# Patient Record
Sex: Female | Born: 1980 | Race: Asian | Hispanic: No | Marital: Married | Smoking: Never smoker
Health system: Southern US, Community
[De-identification: ages and names within clinical notes are randomized; demographics above are authoritative.]

## PROBLEM LIST (undated history)

## (undated) DIAGNOSIS — R51 Headache: Secondary | ICD-10-CM

## (undated) DIAGNOSIS — K219 Gastro-esophageal reflux disease without esophagitis: Secondary | ICD-10-CM

## (undated) DIAGNOSIS — R519 Headache, unspecified: Secondary | ICD-10-CM

## (undated) HISTORY — PX: NO PAST SURGERIES: SHX2092

---

## 2014-07-14 NOTE — L&D Delivery Note (Signed)
Delivery Note At 4:56 PM a viable and healthy female was delivered via Vaginal, Spontaneous Delivery (Presentation: Right Occiput Anterior).  APGAR: 7, 9; weight 7 lb 5.6 oz (3335 g).   Placenta status: Intact, Spontaneous.  Cord: 3 vessels with the following complications: None.   The patient pushed for 3 HOURS without an epidural!  The infant's head delivered over an intact perineum in the ROA presentation with a loose nuchal cord that was reduced on the perineum. The remainder of the infant's body  Delivered and the infant was passed to the maternal abdomen. The placenta was delivered spontaneous, intact, with a three vessel cord. Following delivery of the placenta a partial third-degree perineal laceration was noted. Following infiltration of the perineum with 1% lidocaine without epinephrine the partial 3rd was repaired with interrupted sutures of 0 Vicryl.  The remaining second-degree perineal laceration was repaired in the usual fashion.  Anesthesia: Local  Episiotomy: None Lacerations: 3rd degree Suture Repair: 0 vicryl and 3-0 vicryl Est. Blood Loss (mL): 143  Mom to postpartum.  Baby to Couplet care / Skin to Skin.  Terry Sanders H. 04/24/2015, 9:13 AM

## 2014-10-25 LAB — OB RESULTS CONSOLE GC/CHLAMYDIA
CHLAMYDIA, DNA PROBE: NEGATIVE
GC PROBE AMP, GENITAL: NEGATIVE

## 2014-10-25 LAB — OB RESULTS CONSOLE RPR: RPR: NONREACTIVE

## 2014-10-25 LAB — OB RESULTS CONSOLE RUBELLA ANTIBODY, IGM: Rubella: IMMUNE

## 2014-10-25 LAB — OB RESULTS CONSOLE HIV ANTIBODY (ROUTINE TESTING): HIV: NONREACTIVE

## 2014-10-25 LAB — OB RESULTS CONSOLE HEPATITIS B SURFACE ANTIGEN: Hepatitis B Surface Ag: NEGATIVE

## 2014-10-25 LAB — OB RESULTS CONSOLE ABO/RH: RH Type: POSITIVE

## 2015-04-03 LAB — OB RESULTS CONSOLE GBS: STREP GROUP B AG: NEGATIVE

## 2015-04-23 ENCOUNTER — Inpatient Hospital Stay (HOSPITAL_COMMUNITY)
Admission: AD | Admit: 2015-04-23 | Discharge: 2015-04-25 | DRG: 775 | Disposition: A | Discharge status: Home / Self Care | Payer: Managed Care, Other (non HMO) | Source: Ambulatory Visit | Attending: Obstetrics and Gynecology | Admitting: Obstetrics and Gynecology

## 2015-04-23 ENCOUNTER — Encounter (HOSPITAL_COMMUNITY): Payer: Self-pay | Admitting: *Deleted

## 2015-04-23 DIAGNOSIS — K219 Gastro-esophageal reflux disease without esophagitis: Secondary | ICD-10-CM | POA: Diagnosis present

## 2015-04-23 DIAGNOSIS — Z3A38 38 weeks gestation of pregnancy: Secondary | ICD-10-CM

## 2015-04-23 DIAGNOSIS — R51 Headache: Secondary | ICD-10-CM | POA: Diagnosis present

## 2015-04-23 DIAGNOSIS — IMO0001 Reserved for inherently not codable concepts without codable children: Secondary | ICD-10-CM

## 2015-04-23 DIAGNOSIS — O9962 Diseases of the digestive system complicating childbirth: Secondary | ICD-10-CM | POA: Diagnosis present

## 2015-04-23 HISTORY — DX: Headache: R51

## 2015-04-23 HISTORY — DX: Gastro-esophageal reflux disease without esophagitis: K21.9

## 2015-04-23 HISTORY — DX: Headache, unspecified: R51.9

## 2015-04-23 LAB — CBC
HCT: 40.5 % (ref 36.0–46.0)
Hemoglobin: 13.3 g/dL (ref 12.0–15.0)
MCH: 23.6 pg — ABNORMAL LOW (ref 26.0–34.0)
MCHC: 32.8 g/dL (ref 30.0–36.0)
MCV: 71.9 fL — ABNORMAL LOW (ref 78.0–100.0)
PLATELETS: 165 10*3/uL (ref 150–400)
RBC: 5.63 MIL/uL — AB (ref 3.87–5.11)
RDW: 15.3 % (ref 11.5–15.5)
WBC: 8.3 10*3/uL (ref 4.0–10.5)

## 2015-04-23 LAB — ABO/RH: ABO/RH(D): O POS

## 2015-04-23 LAB — TYPE AND SCREEN
ABO/RH(D): O POS
Antibody Screen: NEGATIVE

## 2015-04-23 MED ORDER — ONDANSETRON HCL 4 MG PO TABS: 4.0000 mg | ORAL_TABLET | ORAL | Status: DC | PRN

## 2015-04-23 MED ORDER — IBUPROFEN 600 MG PO TABS: 600.0000 mg | ORAL_TABLET | Freq: Four times a day (QID) | ORAL | Status: DC

## 2015-04-23 MED ORDER — TETANUS-DIPHTH-ACELL PERTUSSIS 5-2.5-18.5 LF-MCG/0.5 IM SUSP: 0.5000 mL | Freq: Once | INTRAMUSCULAR | Status: DC

## 2015-04-23 MED ORDER — DIPHENHYDRAMINE HCL 50 MG/ML IJ SOLN: 12.5000 mg | INTRAMUSCULAR | Status: DC | PRN

## 2015-04-23 MED ORDER — LIDOCAINE HCL (PF) 1 % IJ SOLN
30.0000 mL | INTRAMUSCULAR | Status: DC | PRN
  Administered 2015-04-23: 30 mL via SUBCUTANEOUS
  Filled 2015-04-23: qty 30

## 2015-04-23 MED ORDER — FENTANYL 2.5 MCG/ML BUPIVACAINE 1/10 % EPIDURAL INFUSION (WH - ANES): 14.0000 mL/h | INTRAMUSCULAR | Status: DC | PRN

## 2015-04-23 MED ORDER — PRENATAL MULTIVITAMIN CH
1.0000 | ORAL_TABLET | Freq: Every day | ORAL | Status: DC
  Administered 2015-04-24 – 2015-04-25 (×2): 1 via ORAL
  Filled 2015-04-23 (×2): qty 1

## 2015-04-23 MED ORDER — IBUPROFEN 600 MG PO TABS
600.0000 mg | ORAL_TABLET | Freq: Four times a day (QID) | ORAL | Status: DC
  Administered 2015-04-23: 600 mg via ORAL
  Filled 2015-04-23: qty 1

## 2015-04-23 MED ORDER — OXYTOCIN 40 UNITS IN LACTATED RINGERS INFUSION - SIMPLE MED
62.5000 mL/h | INTRAVENOUS | Status: DC
  Administered 2015-04-23: 62.5 mL/h via INTRAVENOUS
  Filled 2015-04-23: qty 1000

## 2015-04-23 MED ORDER — DIBUCAINE 1 % RE OINT: 1.0000 "application " | TOPICAL_OINTMENT | RECTAL | Status: DC | PRN

## 2015-04-23 MED ORDER — SIMETHICONE 80 MG PO CHEW: 80.0000 mg | CHEWABLE_TABLET | ORAL | Status: DC | PRN

## 2015-04-23 MED ORDER — IBUPROFEN 600 MG PO TABS
600.0000 mg | ORAL_TABLET | Freq: Four times a day (QID) | ORAL | Status: DC
  Administered 2015-04-23 – 2015-04-25 (×7): 600 mg via ORAL
  Filled 2015-04-23 (×6): qty 1

## 2015-04-23 MED ORDER — FLEET ENEMA 7-19 GM/118ML RE ENEM: 1.0000 | ENEMA | RECTAL | Status: DC | PRN

## 2015-04-23 MED ORDER — LANOLIN HYDROUS EX OINT: TOPICAL_OINTMENT | CUTANEOUS | Status: DC | PRN

## 2015-04-23 MED ORDER — OXYCODONE-ACETAMINOPHEN 5-325 MG PO TABS
1.0000 | ORAL_TABLET | ORAL | Status: DC | PRN
  Administered 2015-04-24: 1 via ORAL
  Filled 2015-04-23: qty 1

## 2015-04-23 MED ORDER — BUTORPHANOL TARTRATE 1 MG/ML IJ SOLN
1.0000 mg | INTRAMUSCULAR | Status: DC | PRN
  Administered 2015-04-23: 1 mg via INTRAVENOUS
  Filled 2015-04-23: qty 1

## 2015-04-23 MED ORDER — BENZOCAINE-MENTHOL 20-0.5 % EX AERO
1.0000 "application " | INHALATION_SPRAY | CUTANEOUS | Status: DC | PRN
  Administered 2015-04-23: 1 via TOPICAL
  Filled 2015-04-23: qty 56

## 2015-04-23 MED ORDER — ONDANSETRON HCL 4 MG/2ML IJ SOLN: 4.0000 mg | Freq: Four times a day (QID) | INTRAMUSCULAR | Status: DC | PRN

## 2015-04-23 MED ORDER — PHENYLEPHRINE 40 MCG/ML (10ML) SYRINGE FOR IV PUSH (FOR BLOOD PRESSURE SUPPORT): 80.0000 ug | PREFILLED_SYRINGE | INTRAVENOUS | Status: DC | PRN

## 2015-04-23 MED ORDER — DIPHENHYDRAMINE HCL 25 MG PO CAPS: 25.0000 mg | ORAL_CAPSULE | Freq: Four times a day (QID) | ORAL | Status: DC | PRN

## 2015-04-23 MED ORDER — OXYCODONE-ACETAMINOPHEN 5-325 MG PO TABS: 1.0000 | ORAL_TABLET | ORAL | Status: DC | PRN

## 2015-04-23 MED ORDER — OXYCODONE-ACETAMINOPHEN 5-325 MG PO TABS: 2.0000 | ORAL_TABLET | ORAL | Status: DC | PRN

## 2015-04-23 MED ORDER — EPHEDRINE 5 MG/ML INJ: 10.0000 mg | INTRAVENOUS | Status: DC | PRN

## 2015-04-23 MED ORDER — CITRIC ACID-SODIUM CITRATE 334-500 MG/5ML PO SOLN: 30.0000 mL | ORAL | Status: DC | PRN

## 2015-04-23 MED ORDER — ONDANSETRON HCL 4 MG/2ML IJ SOLN: 4.0000 mg | INTRAMUSCULAR | Status: DC | PRN

## 2015-04-23 MED ORDER — LACTATED RINGERS IV SOLN: 500.0000 mL | INTRAVENOUS | Status: DC | PRN

## 2015-04-23 MED ORDER — SENNOSIDES-DOCUSATE SODIUM 8.6-50 MG PO TABS
2.0000 | ORAL_TABLET | ORAL | Status: DC
  Administered 2015-04-23 – 2015-04-24 (×2): 2 via ORAL
  Filled 2015-04-23 (×2): qty 2

## 2015-04-23 MED ORDER — WITCH HAZEL-GLYCERIN EX PADS
1.0000 "application " | MEDICATED_PAD | CUTANEOUS | Status: DC | PRN
  Administered 2015-04-23: 1 via TOPICAL

## 2015-04-23 MED ORDER — OXYTOCIN BOLUS FROM INFUSION
500.0000 mL | INTRAVENOUS | Status: DC
  Administered 2015-04-23: 500 mL via INTRAVENOUS

## 2015-04-23 MED ORDER — LACTATED RINGERS IV SOLN
INTRAVENOUS | Status: DC
  Administered 2015-04-23: 09:00:00 via INTRAVENOUS

## 2015-04-23 MED ORDER — ACETAMINOPHEN 325 MG PO TABS: 650.0000 mg | ORAL_TABLET | ORAL | Status: DC | PRN

## 2015-04-23 MED ORDER — ZOLPIDEM TARTRATE 5 MG PO TABS
5.0000 mg | ORAL_TABLET | Freq: Every evening | ORAL | Status: DC | PRN
Start: 2015-04-23 — End: 2015-04-25

## 2015-04-23 NOTE — MAU Note (Signed)
Urine in lab 

## 2015-04-23 NOTE — MAU Note (Signed)
Pt presents to MAU with complaints of leakage of fluid that started around 7 this morning. Mild cramping

## 2015-04-23 NOTE — H&P (Signed)
Terry Sanders is a 34 y.o. female presenting for leaking fluid  34 yo G1p0 At 38+4 presents c/o leaking fluid and was found to be 5-6 cm dilated.   History OB History    Gravida Para Term Preterm AB TAB SAB Ectopic Multiple Living   0 0 0 0 0 0 1     Past Medical History  Diagnosis Date  . GERD (gastroesophageal reflux disease)     on Prilosec  . Headache     relieved by Tylenol   Past Surgical History  Procedure Laterality Date  . No past surgeries     Family History: family history is not on file. Social History:  reports that she has never smoked. She has never used smokeless tobacco. She reports that she does not drink alcohol or use illicit drugs.   Prenatal Transfer Tool  Maternal Diabetes: No Genetic Screening: normal Maternal Ultrasounds/Referrals: Normal Fetal Ultrasounds or other Referrals:  None Maternal Substance Abuse:  No Significant Maternal Medications:  None Significant Maternal Lab Results:  None Other Comments:  None  ROS  Dilation: 10 Effacement (%): 100 Station: Crowning Exam by:: Dr. Tenny Craw Blood pressure 134/81, pulse 97, temperature 98.6 F (37 C), temperature source Oral, resp. rate 18, height  (1.499 m), weight 132 lb (59.875 kg), last menstrual period 07/27/2014, SpO2 98 %, unknown if currently breastfeeding. Exam Physical Exam  Prenatal labs: ABO, Rh: --/--/O POS, O POS (10/10 0915) Antibody: NEG (10/10 0915) Rubella: Immune (04/13 0000) RPR: Nonreactive (04/13 0000)  HBsAg: Negative (04/13 0000)  HIV: Non-reactive (04/13 0000)  GBS: Negative (09/20 0000)   Assessment/Plan: 1) Admit 2) Epidural on request 3) Anticipate svd   Ammarie Matsuura H. 04/23/2015, 8:54 PM

## 2015-04-24 LAB — CBC
HCT: 31 % — ABNORMAL LOW (ref 36.0–46.0)
Hemoglobin: 10.1 g/dL — ABNORMAL LOW (ref 12.0–15.0)
MCH: 23.2 pg — ABNORMAL LOW (ref 26.0–34.0)
MCHC: 32.6 g/dL (ref 30.0–36.0)
MCV: 71.1 fL — ABNORMAL LOW (ref 78.0–100.0)
PLATELETS: 144 10*3/uL — AB (ref 150–400)
RBC: 4.36 MIL/uL (ref 3.87–5.11)
RDW: 15 % (ref 11.5–15.5)
WBC: 15.4 10*3/uL — AB (ref 4.0–10.5)

## 2015-04-24 LAB — RPR: RPR Ser Ql: NONREACTIVE

## 2015-04-24 NOTE — Progress Notes (Signed)
Patient is eating, ambulating, voiding.  Pain control is good.  Appropriate lochia, no complaints.  Filed Vitals:   04/23/15 1905 04/23/15 2005 04/24/15 0020 04/24/15 0851  BP: 122/80 134/81 125/84 122/74  Pulse: 89 97 92 81  Temp: 99.2 F (37.3 C) 98.6 F (37 C) 99.4 F (37.4 C) 98.9 F (37.2 C)  TempSrc: Oral Oral Oral Oral  Resp: Height:      Weight:      SpO2: 98%       Fundus firm Perineum without swelling. Ext: no CT  Lab Results  Component Value Date   WBC 15.4* 04/24/2015   HGB 10.1* 04/24/2015   HCT 31.0* 04/24/2015   MCV 71.1* 04/24/2015   PLT 144* 04/24/2015    --/--/O POS, O POS (10/10 0915)  A/P Post partum day 1. Pt doing well. Circ desired, consent obtained.  Awaiting ok by peds as baby not feeding or urinating well  Routine care.  Expect d/c 10/12.    Terry Sanders

## 2015-04-25 MED ORDER — IBUPROFEN 600 MG PO TABS: 600.0000 mg | ORAL_TABLET | Freq: Four times a day (QID) | ORAL | Status: DC

## 2015-04-25 MED ORDER — OXYCODONE-ACETAMINOPHEN 5-325 MG PO TABS: 1.0000 | ORAL_TABLET | ORAL | Status: DC | PRN

## 2015-04-25 MED ORDER — SENNOSIDES-DOCUSATE SODIUM 8.6-50 MG PO TABS: 2.0000 | ORAL_TABLET | Freq: Every evening | ORAL | Status: DC | PRN

## 2015-04-25 NOTE — Progress Notes (Signed)
Post Partum Day 2 Subjective: no complaints, up ad lib, voiding, tolerating PO, + flatus and bottle feeding  Objective: Blood pressure 139/82, pulse 83, temperature 98 F (36.7 C), temperature source Oral, resp. rate 18, height 4\' 11"  (1.499 m), weight 59.875 kg (132 lb), last menstrual period 07/27/2014, SpO2 98 %, unknown if currently breastfeeding.  Physical Exam:  General: alert, cooperative and no distress Lochia: appropriate Uterine Fundus: firm DVT Evaluation: No evidence of DVT seen on physical exam. Negative Homan's sign. No cords or calf tenderness. No significant calf/ankle edema.   Recent Labs  04/23/15 0915 04/24/15 0528  HGB 13.3 10.1*  HCT 40.5 31.0*    Assessment/Plan: Discharge home   LOS: 2 days   Terry Sanders Terry Sanders 04/25/2015, 11:10 AM

## 2015-04-25 NOTE — Discharge Summary (Signed)
Obstetric Discharge Summary Reason for Admission: onset of labor and spontaneous abortion Prenatal Procedures: none Intrapartum Procedures: spontaneous vaginal delivery Postpartum Procedures: none Complications-Operative and Postpartum: 3rd degree perineal laceration HEMOGLOBIN  Date Value Ref Range Status  04/24/2015 10.1* 12.0 - 15.0 g/dL Final    Comment:    DELTA CHECK NOTED REPEATED TO VERIFY    HCT  Date Value Ref Range Status  04/24/2015 31.0* 36.0 - 46.0 % Final    Physical Exam:  General: alert, cooperative and no distress Lochia: appropriate Uterine Fundus: firm DVT Evaluation: No evidence of DVT seen on physical exam. Negative Homan's sign. No cords or calf tenderness. No significant calf/ankle edema.   Discharge Diagnoses: Term Pregnancy-delivered  Discharge Information: Date: 04/25/2015 Activity: pelvic rest Diet: routine Medications: Ibuprofen, Colace and Percocet Condition: stable Instructions: refer to practice specific booklet Discharge to: home   Newborn Data: Live born female  Birth Weight: 7 lb 5.6 oz (3335 g) APGAR: 7, 9  Home with mother.  Essie HartINN, Alanea Woolridge [name] 04/25/2015, 11:13 AM

## 2017-10-13 ENCOUNTER — Encounter: Payer: Self-pay | Admitting: Family Medicine

## 2017-10-13 ENCOUNTER — Ambulatory Visit: Payer: Managed Care, Other (non HMO) | Admitting: Family Medicine

## 2017-10-13 VITALS — BP 118/70 | HR 100 | Temp 98.3°F | Wt 110.0 lb

## 2017-10-13 DIAGNOSIS — J069 Acute upper respiratory infection, unspecified: Secondary | ICD-10-CM

## 2017-10-13 DIAGNOSIS — J029 Acute pharyngitis, unspecified: Secondary | ICD-10-CM

## 2017-10-13 DIAGNOSIS — B9789 Other viral agents as the cause of diseases classified elsewhere: Secondary | ICD-10-CM

## 2017-10-13 LAB — POCT RAPID STREP A (OFFICE): RAPID STREP A SCREEN: NEGATIVE

## 2017-10-13 MED ORDER — BENZONATATE 100 MG PO CAPS: 100.0000 mg | ORAL_CAPSULE | Freq: Two times a day (BID) | ORAL | 0 refills | Status: AC | PRN

## 2017-10-13 NOTE — Progress Notes (Signed)
Subjective:    Patient ID: Terry Sanders, female    DOB: 03/28/1981, 37 y.o.   MRN: [id number]  Chief Complaint  Patient presents with  . Sore Throat    HPI Patient was seen today for acute concern.  Pt endorses sore throat and cough times 6 days.  Pt was seen by UC on 3/29 for similar symptoms.  At that time patient was told she had a viral URI as we test was negative.  Pt was given Zyrtec and Flonase.  She took both for 2 days but stopped as she did not notice an improvement in symptoms.  Pt also endorses pressure in her ears and coughing and rhinorrhea if she talks too much.  Pt endorses difficulty sleeping 2/2 cough.  Pt denies sick contacts, fever, chills, n/v, ear pain, facial pain, or pain in teeth.  Past Medical History:  Diagnosis Date  . GERD (gastroesophageal reflux disease)    on Prilosec  . Headache    relieved by Tylenol    No Known Allergies  ROS General: Denies fever, chills, night sweats, changes in weight, changes in appetite HEENT: Denies headaches, ear pain, changes in vision      +rhinorrhea, sore throat CV: Denies CP, palpitations, SOB, orthopnea Pulm: Denies SOB, wheezing  + cough GI: Denies abdominal pain, nausea, vomiting, diarrhea, constipation GU: Denies dysuria, hematuria, frequency, vaginal discharge Msk: Denies muscle cramps, joint pains Neuro: Denies weakness, numbness, tingling Skin: Denies rashes, bruising Psych: Denies depression, anxiety, hallucinations     Objective:    Blood pressure 118/70, pulse 100, temperature 98.3 F (36.8 C), temperature source Oral, weight 110 lb (49.9 kg), SpO2 97 %, unknown if currently breastfeeding.   Gen. Pleasant, well-nourished, in no distress, normal affect   HEENT: [place]/AT, face symmetric, no scleral icterus, PERRLA, nares patent without drainage, pharynx without erythema or exudate.  TMs normal bilaterally.  No cervical lymphadenopathy. Lungs: no accessory muscle use, CTAB, no wheezes or rales Cardiovascular:  RRR, no m/r/g, no peripheral edema Neuro:  A&Ox3, CN II-XII intact, normal gait   Wt Readings from Last 3 Encounters:  10/13/17 110 lb (49.9 kg)  04/23/15 132 lb (59.9 kg)    Lab Results  Component Value Date   WBC 15.4 (H) 04/24/2015   HGB 10.1 (L) 04/24/2015   HCT 31.0 (L) 04/24/2015   PLT 144 (L) 04/24/2015    Assessment/Plan:  Viral URI with cough  -Continue supportive care -Patient encouraged to gargle with warm salt water or Chloraseptic spray and increase p.o. intake of fluids -Tylenol okay for any pain/discomfort - Plan: benzonatate (TESSALON) 100 MG capsule  Sore throat - Plan: POC Rapid Strep A negative  Patient encouraged to keep follow-up for new patient appointment in the next few days.  Terry AmsterdamShannon Corsica Franson, MD

## 2017-10-13 NOTE — Patient Instructions (Addendum)
You can gargle your throat with with warm salt water or Chloraseptic spray.  I have sent a prescription for Tessalon, a medicine to help with your cough to your pharmacy.  You can also take Tylenol as needed for any pain/discomfort. Cough, Adult Coughing is a reflex that clears your throat and your airways. Coughing helps to heal and protect your lungs. It is normal to cough occasionally, but a cough that happens with other symptoms or lasts a long time may be a sign of a condition that needs treatment. A cough may last only 2-3 weeks (acute), or it may last longer than 8 weeks (chronic). What are the causes? Coughing is commonly caused by:  Breathing in substances that irritate your lungs.  A viral or bacterial respiratory infection.  Allergies.  Asthma.  Postnasal drip.  Smoking.  Acid backing up from the stomach into the esophagus (gastroesophageal reflux).  Certain medicines.  Chronic lung problems, including COPD (or rarely, lung cancer).  Other medical conditions such as heart failure.  Follow these instructions at home: Pay attention to any changes in your symptoms. Take these actions to help with your discomfort:  Take medicines only as told by your health care provider. ? If you were prescribed an antibiotic medicine, take it as told by your health care provider. Do not stop taking the antibiotic even if you start to feel better. ? Talk with your health care provider before you take a cough suppressant medicine.  Drink enough fluid to keep your urine clear or pale yellow.  If the air is dry, use a cold steam vaporizer or humidifier in your bedroom or your home to help loosen secretions.  Avoid anything that causes you to cough at work or at home.  If your cough is worse at night, try sleeping in a semi-upright position.  Avoid cigarette smoke. If you smoke, quit smoking. If you need help quitting, ask your health care provider.  Avoid caffeine.  Avoid  alcohol.  Rest as needed.  Contact a health care provider if:  You have new symptoms.  You cough up pus.  Your cough does not get better after 2-3 weeks, or your cough gets worse.  You cannot control your cough with suppressant medicines and you are losing sleep.  You develop pain that is getting worse or pain that is not controlled with pain medicines.  You have a fever.  You have unexplained weight loss.  You have night sweats. Get help right away if:  You cough up blood.  You have difficulty breathing.  Your heartbeat is very fast. This information is not intended to replace advice given to you by your health care provider. Make sure you discuss any questions you have with your health care provider. Document Released: 12/27/2010 Document Revised: 12/06/2015 Document Reviewed: 09/06/2014 Elsevier Interactive Patient Education  2018 Elsevier Inc.  Sore Throat When you have a sore throat, your throat may:  Hurt.  Burn.  Feel irritated.  Feel scratchy.  Many things can cause a sore throat, including:  An infection.  Allergies.  Dryness in the air.  Smoke or pollution.  Gastroesophageal reflux disease (GERD).  A sore throat can be the first sign of another sickness. It can happen with other problems, like coughing or a fever. Most sore throats go away without treatment. Follow these instructions at home:  Take over-the-counter medicines only as told by your doctor.  Drink enough fluids to keep your pee (urine) clear or pale yellow.  Rest  when you feel you need to.  To help with pain, try: ? Sipping warm liquids, such as broth, herbal tea, or warm water. ? Eating or drinking cold or frozen liquids, such as frozen ice pops. ? Gargling with a salt-water mixture 3-4 times a day or as needed. To make a salt-water mixture, add -1 tsp of salt in 1 cup of warm water. Mix it until you cannot see the salt anymore. ? Sucking on hard candy or throat  lozenges. ? Putting a cool-mist humidifier in your bedroom at night. ? Sitting in the bathroom with the door closed for 5-10 minutes while you run hot water in the shower.  Do not use any tobacco products, such as cigarettes, chewing tobacco, and e-cigarettes. If you need help quitting, ask your doctor. Contact a doctor if:  You have a fever for more than 2-3 days.  You keep having symptoms for more than 2-3 days.  Your throat does not get better in 7 days.  You have a fever and your symptoms suddenly get worse. Get help right away if:  You have trouble breathing.  You cannot swallow fluids, soft foods, or your saliva.  You have swelling in your throat or neck that gets worse.  You keep feeling like you are going to throw up (vomit).  You keep throwing up. This information is not intended to replace advice given to you by your health care provider. Make sure you discuss any questions you have with your health care provider. Document Released: 04/08/2008 Document Revised: 02/24/2016 Document Reviewed: 04/20/2015 Elsevier Interactive Patient Education  [employer].

## 2017-10-19 ENCOUNTER — Encounter: Payer: Self-pay | Admitting: Family Medicine

## 2017-10-19 ENCOUNTER — Ambulatory Visit: Payer: Managed Care, Other (non HMO) | Admitting: Family Medicine

## 2017-10-19 VITALS — BP 102/64 | HR 90 | Temp 98.1°F | Ht 59.0 in | Wt 110.0 lb

## 2017-10-19 DIAGNOSIS — E049 Nontoxic goiter, unspecified: Secondary | ICD-10-CM

## 2017-10-19 DIAGNOSIS — Z7689 Persons encountering health services in other specified circumstances: Secondary | ICD-10-CM | POA: Diagnosis not present

## 2017-10-19 LAB — T4, FREE: FREE T4: 0.9 ng/dL (ref 0.60–1.60)

## 2017-10-19 LAB — TSH: TSH: 0.76 u[IU]/mL (ref 0.35–4.50)

## 2017-10-19 NOTE — Patient Instructions (Signed)
Goiter A goiter is an enlarged thyroid gland. The thyroid gland is located in the lower front of the neck. The gland produces hormones that regulate mood, body temperature, pulse rate, and digestion. Most goiters are painless and are not a cause for serious concern. Goiters and conditions that cause goiters can be treated, if necessary. What are the causes? Causes of this condition include:  Diseases that attack healthy cells in your body (autoimmune diseases) and affect your thyroid function, such as: ? Graves disease. This causes too much thyroid hormone to be produced and it makes your thyroid overly active (hyperthyroidism). ? Hashimoto disease. This type of inflammation of the thyroid (thyroiditis) causes too little thyroid hormone to be produced and it makes your thyroid not active enough (hypothyroidism).  Other conditions that cause thyroiditis.  Nodular goiter. This means that there are one or more small growths on your thyroid. These can create too much thyroid hormone.  Pregnancy.  Thyroid cancer. This is rare.  Certain medicines.  Radiation exposure.  Iodine deficiency.  In some cases, the cause may not be known (idiopathic). What increases the risk? This condition is more likely to develop in:  People who have a family history of goiter.  Women.  People who do not get enough iodine in their diet.  People who are older than 40.  People who smoke tobacco.  What are the signs or symptoms? Common symptoms of this condition include:  Swelling in the lower part of the neck. This swelling can range from a very small bump to a large lump.  A tight feeling in the throat.  A hoarse voice.  Other symptoms include:  Coughing.  Wheezing.  Difficulty swallowing.  Difficulty breathing.  Bulging neck veins.  Dizziness.  In some cases, there are no symptoms and thyroid hormone levels may be normal. When a goiter is the result of hyperthyroidism, symptoms may  also include:  Nervousness or restlessness.  Inability to tolerate heat.  Unexplained weight loss.  Diarrhea.  Change in the texture of hair or skin.  Changes in heart beat, such as skipped beats, extra beats, or a rapid heart rate.  Loss of menstruation.  Shaky hands.  Increased appetite.  Sleep problems.  When a goiter is the result of hypothyroidism, symptoms may also include:  Feeling like you have no energy (lethargy).  Inability to tolerate cold.  Weight gain that is not explained by a change in diet or exercise habits.  Dry skin.  Coarse hair.  Menstrual irregularity.  Constipation.  Sadness or depression.  How is this diagnosed? This condition may be diagnosed with a medical history and physical exam. You may also have other tests, including:  Blood tests to check thyroid function.  Imaging tests, such as: ? Ultrasonography. ? CT scan. ? MRI. ? Thyroid scan. You will be given a safe radioactive injection, then images will be taken of your thyroid.  Tissue sample (biopsy) of the goiter or any nodules. This checks to see if the goiter or nodules are cancerous.  How is this treated? Treatment for this condition depends on the cause. Treatment may include:  Medicines to control your thyroid.  Anti-inflammatory or steroid medicines, if inflammation is the cause.  Iodine supplements or changes in diet, if the goiter is caused by iodine deficiency.  Radiation therapy.  Surgery to remove your thyroid.  In some cases, no treatment is necessary, and your health care provider will monitor your condition at regular checkups. Follow these instructions at   home:  Follow recommendations from your health care provider for any changes to your diet.  Take over-the-counter and prescription medicines only as told by your health care provider.  Do not use any tobacco products, including cigarettes, chewing tobacco, or e-cigarettes. If you need help quitting,  ask your health care provider.  Keep all follow-up appointments as told by your health care provider. This is important. Contact a health care provider if:  Your symptoms do not get better with treatment. Get help right away if:  You develop sudden, unexplained confusion or other mental changes.  You have nausea, vomiting, or diarrhea.  You develop a fever.  Your skin or the whites of your eyes appear yellow (jaundice).  You develop chest pain.  You have trouble breathing or swallowing.  You suddenly become very weak.  You experience extreme restlessness. This information is not intended to replace advice given to you by your health care provider. Make sure you discuss any questions you have with your health care provider. Document Released: 12/18/2009 Document Revised: 01/18/2016 Document Reviewed: 06/26/2014 Elsevier Interactive Patient Education  2018 Elsevier Inc.  

## 2017-10-19 NOTE — Progress Notes (Signed)
Patient presents to clinic today to establish care.  She was seen on 10/13/17 for an acute visit for URI.  SUBJECTIVE: PMH: Pt is a 37 yo female with no sig pmh.  Pt states she is "80-90% better" since last OFV.  Pt is not currently on any medications and is without complaint.  Allergies: NKDA  Past Surgical hx: None  Social Hx: Pt is married.  Pt has a 2.5 yo son.  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  Pt denies tobacco, EtOH, and drug use.   Family Med Hx: Mom-alive Dad- desc, liver cancer Sister-Kim, alive Brother- Charlynn CourtDola, alive Brother-Hung-alive  Health Maintenance: Dental -- Dr. Elroy ChannelIrvin immunizations -- influenza vaccine 2018 PAP -- 2016?  Pt unsure, but is seen by OB/Gyn.    Past Medical History:  Diagnosis Date  . GERD (gastroesophageal reflux disease)    on Prilosec  . Headache    relieved by Tylenol    Past Surgical History:  Procedure Laterality Date  . NO PAST SURGERIES      Current Outpatient Medications on File Prior to Visit  Medication Sig Dispense Refill  . benzonatate (TESSALON) 100 MG capsule Take 1 capsule (100 mg total) by mouth 2 (two) times daily as needed for cough. 20 capsule 0   No current facility-administered medications on file prior to visit.     No Known Allergies  History reviewed. No pertinent family history.  Social History   Socioeconomic History  . Marital status: Married    Spouse name: Not on file  . Number of children: Not on file  . Years of education: Not on file  . Highest education level: Not on file  Occupational History  . Not on file  Social Needs  . Financial resource strain: Not on file  . Food insecurity:    Worry: Not on file    Inability: Not on file  . Transportation needs:    Medical: Not on file    Non-medical: Not on file  Tobacco Use  . Smoking status: Never Smoker  . Smokeless tobacco: Never Used  Substance and Sexual Activity  . Alcohol use: No  . Drug use: No  . Sexual  activity: Yes  Lifestyle  . Physical activity:    Days per week: Not on file    Minutes per session: Not on file  . Stress: Not on file  Relationships  . Social connections:    Talks on phone: Not on file    Gets together: Not on file    Attends religious service: Not on file    Active member of club or organization: Not on file    Attends meetings of clubs or organizations: Not on file    Relationship status: Not on file  . Intimate partner violence:    Fear of current or ex partner: Not on file    Emotionally abused: Not on file    Physically abused: Not on file    Forced sexual activity: Not on file  Other Topics Concern  . Not on file  Social History Narrative  . Not on file    ROS General: Denies fever, chills, night sweats, changes in weight, changes in appetite HEENT: Denies headaches, ear pain, changes in vision, rhinorrhea, sore throat CV: Denies CP, palpitations, SOB, orthopnea Pulm: Denies SOB, cough, wheezing GI: Denies abdominal pain, nausea, vomiting, diarrhea, constipation GU: Denies dysuria, hematuria, frequency, vaginal discharge Msk: Denies muscle cramps, joint pains Neuro: Denies weakness, numbness,  tingling Skin: Denies rashes, bruising Psych: Denies depression, anxiety, hallucinations  BP 102/64 (BP Location: Left Arm, Patient Position: Sitting, Cuff Size: Normal)   Pulse 90   Temp 98.1 F (36.7 C) (Oral)   Ht 4\' 11"  (1.499 m)   Wt 110 lb (49.9 kg)   SpO2 98%   BMI 22.22 kg/m   Physical Exam Gen. Pleasant, well developed, well-nourished, in NAD HEENT - /AT, PERRL, no scleral icterus, no nasal drainage, pharynx without erythema or exudate. Neck: No JVD, full appearance of R neck- mild thyromegaly Lungs: no use of accessory muscles, CTAB, no wheezes, rales or rhonchi Cardiovascular: RRR, No r/g/m, no peripheral edema Abdomen: BS present, soft, nontender, nondistended Neuro:  A&Ox3, CN II-XII intact, normal gait  Recent Results (from the  past 2160 hour(s))  POC Rapid Strep A     Status: Normal   Collection Time: 10/13/17  4:56 PM  Result Value Ref Range   Rapid Strep A Screen Negative Negative    Assessment/Plan: Goiter  -mildly enlarged R side of neck on exam -discussed possible caused. Pt amenable to work up. -Plan: US THYROID, TSH, T4, free  Establish care --We reviewed the PMH, PSH, FH, SH, Meds and Allergies. -We provided refills for any medications we will prescribe as needed. -We addressed current concerns per orders and patient instructions. -We have asked for records for pertinent exams, studies, vaccines and notes from previous providers. -We have advised patient to follow up per instructions below.  F/u prn  Abbe Amsterdam, MD

## 2017-10-22 ENCOUNTER — Telehealth: Payer: Self-pay | Admitting: Family Medicine

## 2017-10-22 NOTE — Telephone Encounter (Signed)
Patient calling back to check on the results from her last visit. Patient states that she has to go to work and if she does not answer the phone, that a detailed message can be left on her voicemail

## 2017-10-22 NOTE — Telephone Encounter (Signed)
Spoke with pt and documented in result note.

## 2017-10-22 NOTE — Telephone Encounter (Signed)
Copied from CRM [id number]. Topic: Quick Communication - Lab Results >> Oct 22, 2017  9:01 AM Louie BunPalacios Medina, Rosey Batheresa D wrote: Patient called to get her lab results. Please call patient back, thanks.

## 2017-10-27 ENCOUNTER — Ambulatory Visit
Admission: RE | Admit: 2017-10-27 | Discharge: 2017-10-27 | Disposition: A | Discharge status: Home / Self Care | Payer: Managed Care, Other (non HMO) | Source: Ambulatory Visit | Attending: Family Medicine | Admitting: Family Medicine

## 2017-10-27 DIAGNOSIS — E049 Nontoxic goiter, unspecified: Secondary | ICD-10-CM

## 2017-10-28 ENCOUNTER — Encounter: Payer: Self-pay | Admitting: Family Medicine

## 2020-10-19 ENCOUNTER — Other Ambulatory Visit: Payer: Self-pay | Admitting: Obstetrics and Gynecology

## 2020-10-19 DIAGNOSIS — N6310 Unspecified lump in the right breast, unspecified quadrant: Secondary | ICD-10-CM

## 2021-09-16 ENCOUNTER — Other Ambulatory Visit: Payer: Self-pay

## 2021-09-16 ENCOUNTER — Other Ambulatory Visit: Payer: Self-pay | Admitting: Obstetrics and Gynecology

## 2021-09-16 ENCOUNTER — Ambulatory Visit
Admission: RE | Admit: 2021-09-16 | Discharge: 2021-09-16 | Disposition: A | Discharge status: Home / Self Care | Payer: Managed Care, Other (non HMO) | Source: Ambulatory Visit | Attending: Obstetrics and Gynecology | Admitting: Obstetrics and Gynecology

## 2021-09-16 DIAGNOSIS — N6310 Unspecified lump in the right breast, unspecified quadrant: Secondary | ICD-10-CM

## 2022-03-24 ENCOUNTER — Inpatient Hospital Stay: Admission: RE | Admit: 2022-03-24 | Payer: Managed Care, Other (non HMO) | Source: Ambulatory Visit

## 2023-07-03 IMAGING — MG DIGITAL DIAGNOSTIC BILAT W/ TOMO W/ CAD
6 of 10 series · 6 of 30 positions shown · non-contrast
Comparison: None.

CLINICAL DATA: 40-year-old female with a palpable lump in the right
breast, unchanged several years.

EXAM:
DIGITAL DIAGNOSTIC BILATERAL MAMMOGRAM WITH TOMOSYNTHESIS AND CAD;
ULTRASOUND RIGHT BREAST LIMITED
TECHNIQUE: Bilateral digital diagnostic mammography and breast tomosynthesis
was performed. The images were evaluated with computer-aided
detection.; Targeted ultrasound examination of the right breast was
performed

[R MLO synth-2D (1 of 2)]
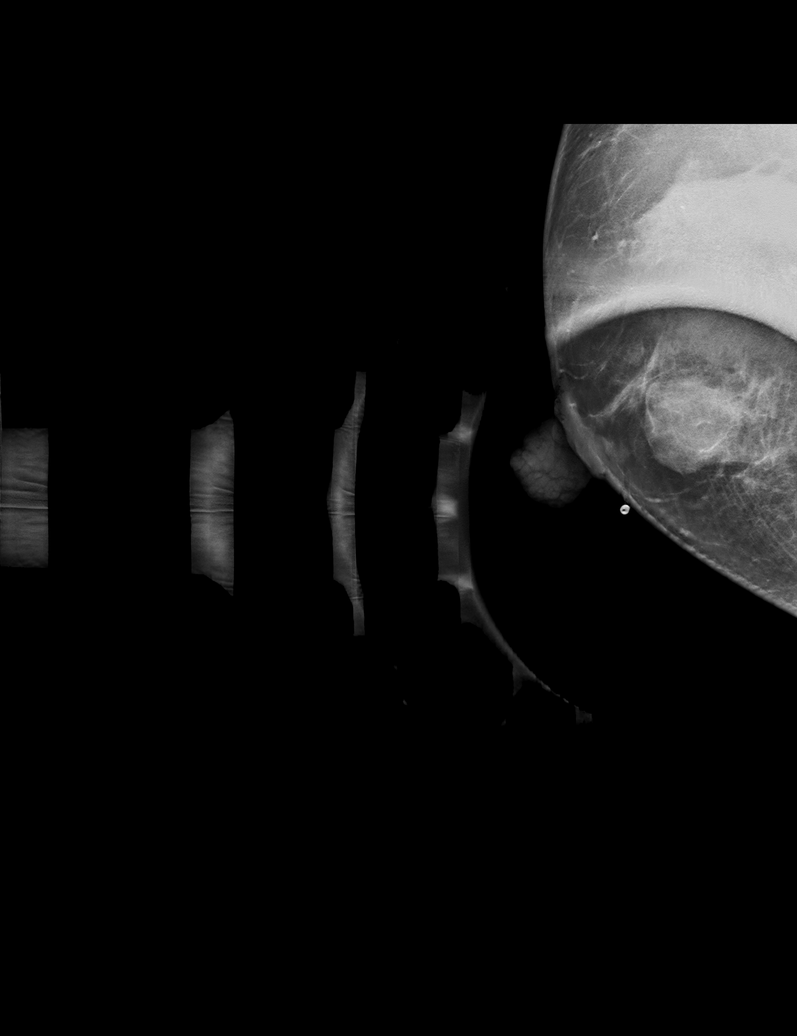

[R MLO synth-2D (2 of 2)]
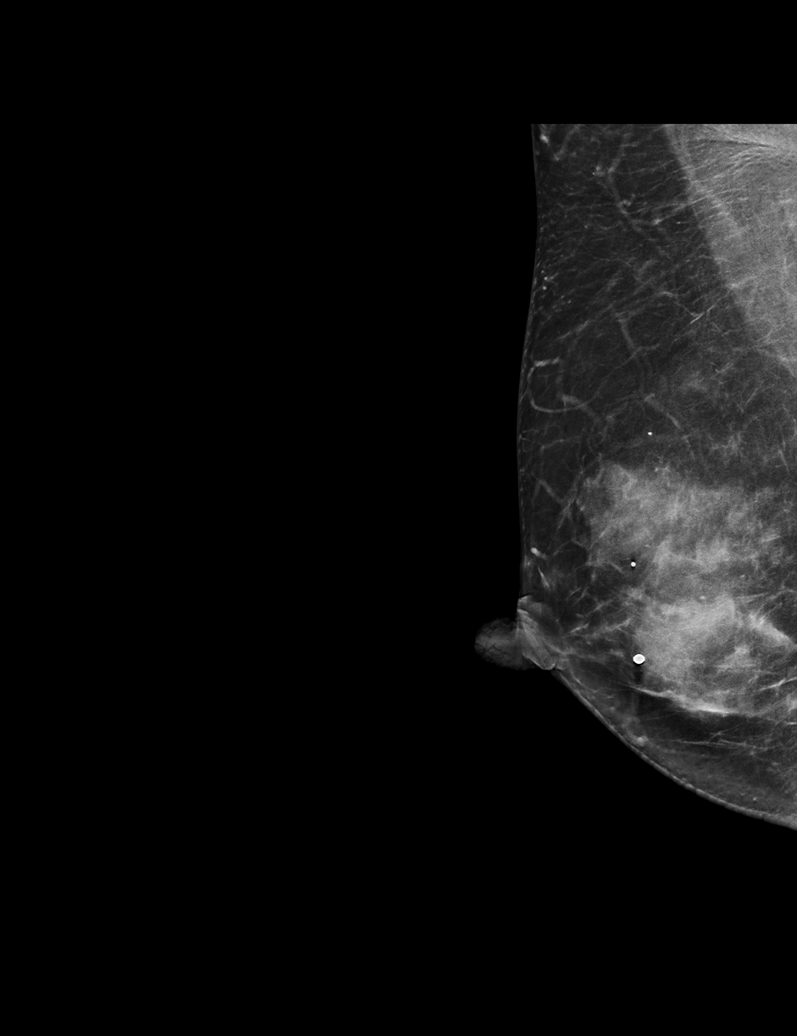

[L CC synth-2D]
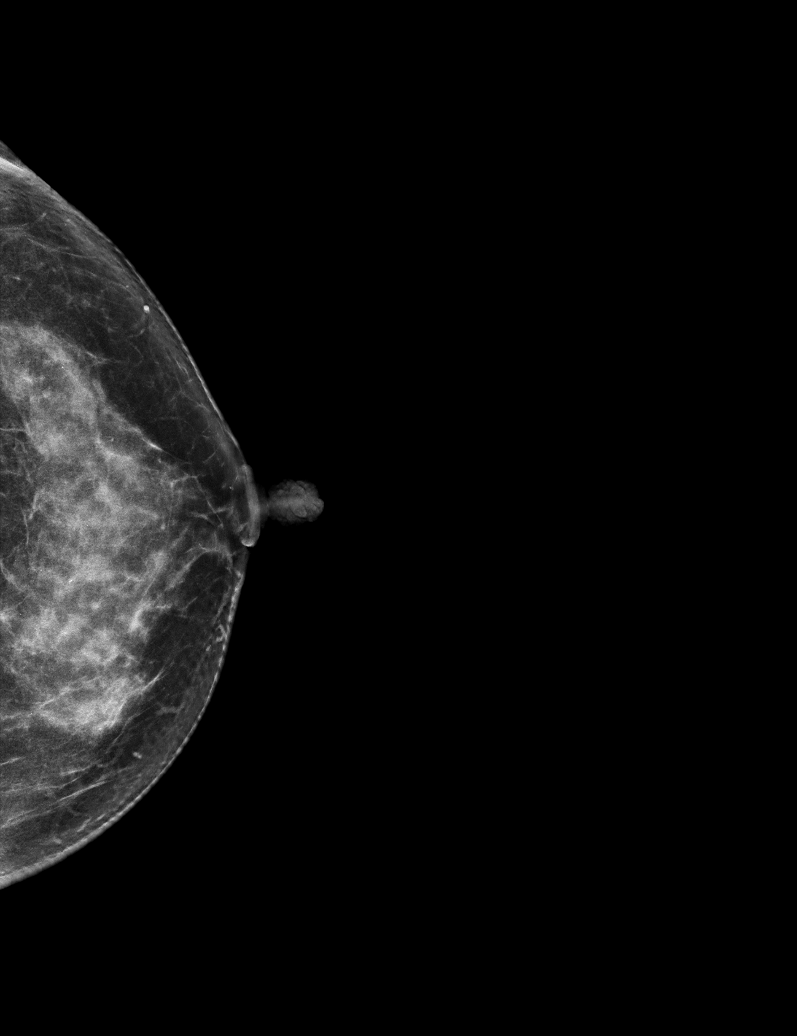

[L MLO synth-2D]
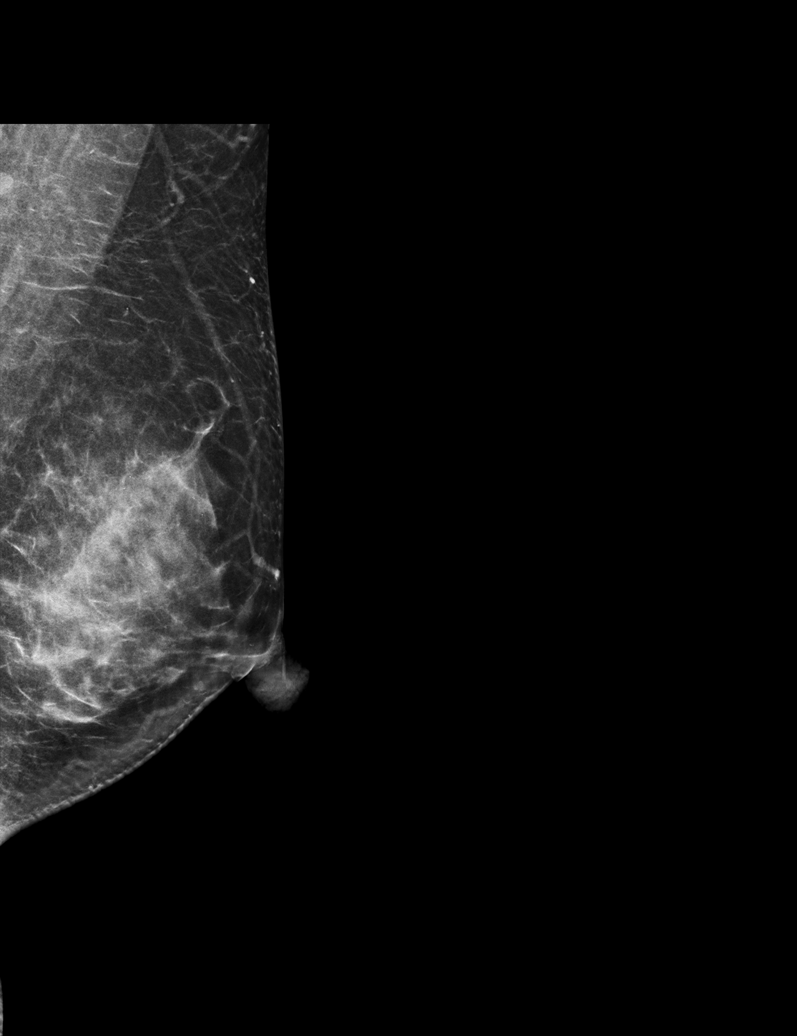

[R CC synth-2D]
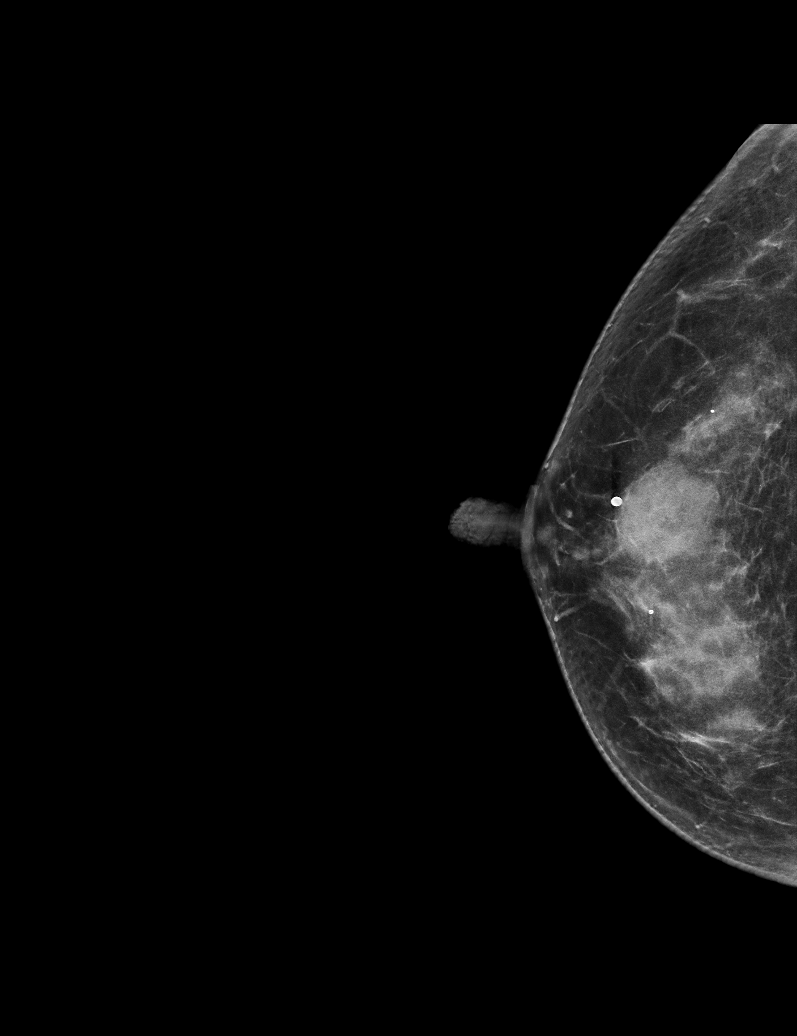

[R CC tomo · tomo slice 33/64.0]
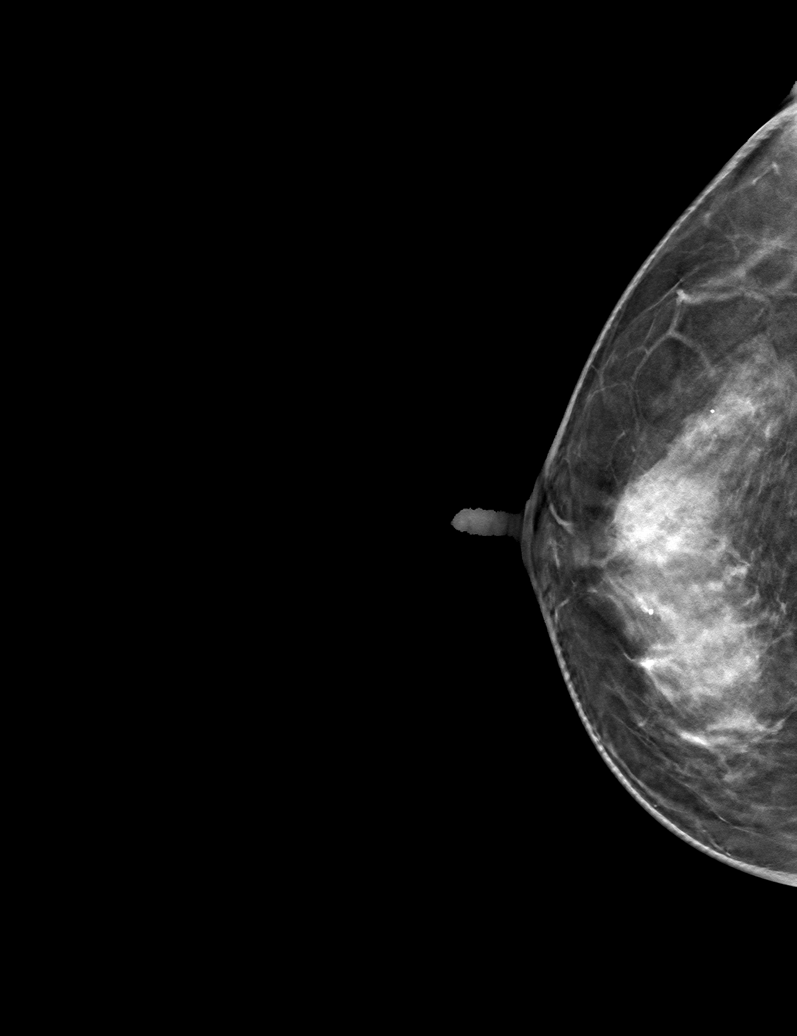

[6 of 30 positions shown; findings below may reference images not displayed]

ACR Breast Density Category c: The breast tissue is heterogeneously
dense, which may obscure small masses.
FINDINGS: A radiopaque BB was placed at the site of the patient's subareolar
right breast lump. An oval, circumscribed equal density mass is seen
deep to the radiopaque BB. Otherwise, no focal or suspicious
mammographic findings in either breast.

Targeted ultrasound is performed, showing an oval, circumscribed
hypoechoic mass at the 6 o'clock retroareolar position. It measures
2.1 x 1.6 x 1.9 cm. There is internal vascularity. This correlates
well with the mammographic finding in the patient's palpable lump.
IMPRESSION: 1. Probably benign, probable right breast fibroadenoma corresponding
with the patient's right breast lump. Recommend short-term imaging
follow-up.
2. No mammographic evidence of malignancy on the left.

RECOMMENDATION:
Right breast ultrasound in 6 months.

I have discussed the findings and recommendations with the patient.
If applicable, a reminder letter will be sent to the patient
regarding the next appointment.

BI-RADS CATEGORY  3: Probably benign.

## 2023-07-03 IMAGING — US US BREAST*R* LIMITED INC AXILLA
1 series · 6 of 6 positions shown · non-contrast
Comparison: None.

CLINICAL DATA: 40-year-old female with a palpable lump in the right
breast, unchanged several years.

EXAM:
DIGITAL DIAGNOSTIC BILATERAL MAMMOGRAM WITH TOMOSYNTHESIS AND CAD;
ULTRASOUND RIGHT BREAST LIMITED
TECHNIQUE: Bilateral digital diagnostic mammography and breast tomosynthesis
was performed. The images were evaluated with computer-aided
detection.; Targeted ultrasound examination of the right breast was
performed

[Series 1: us breast*right* limited inc axilla · 0.06mm/px · 6 of 6 slices shown]
[im 1/6]
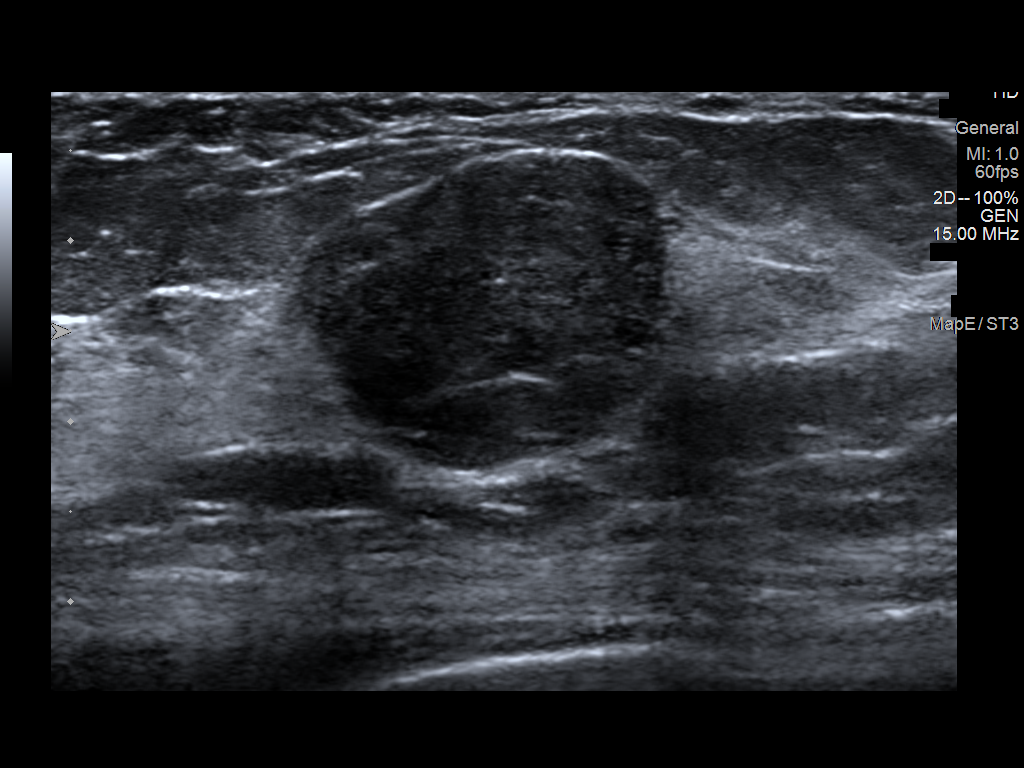
[im 2/6]
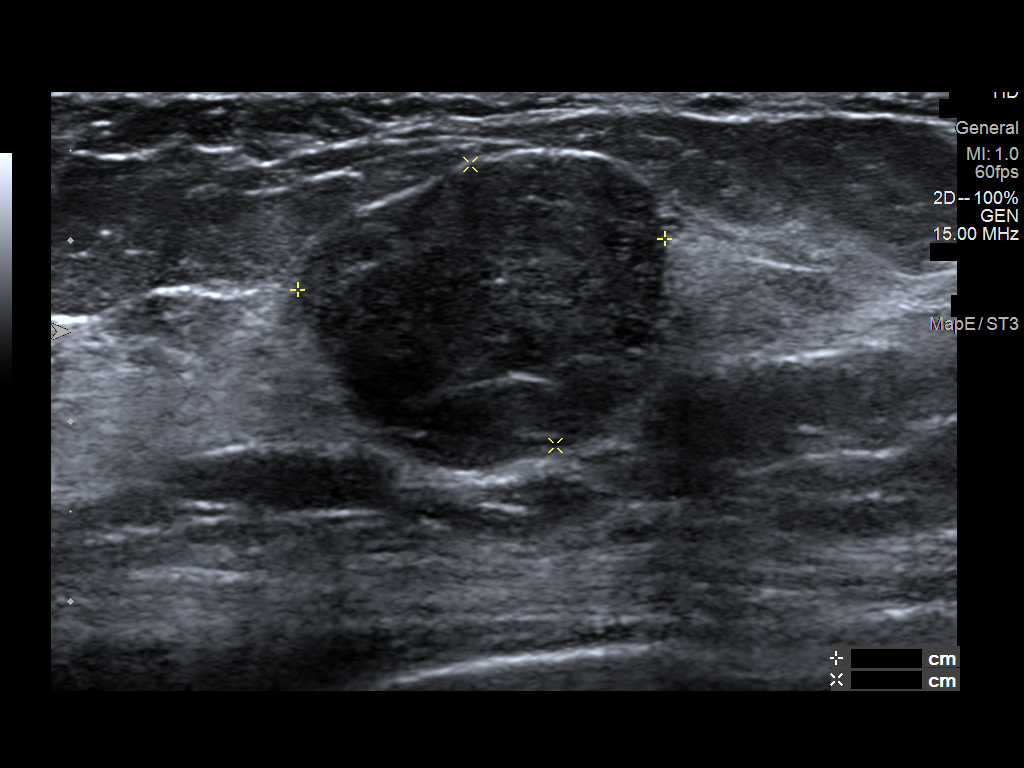
[im 3/6]
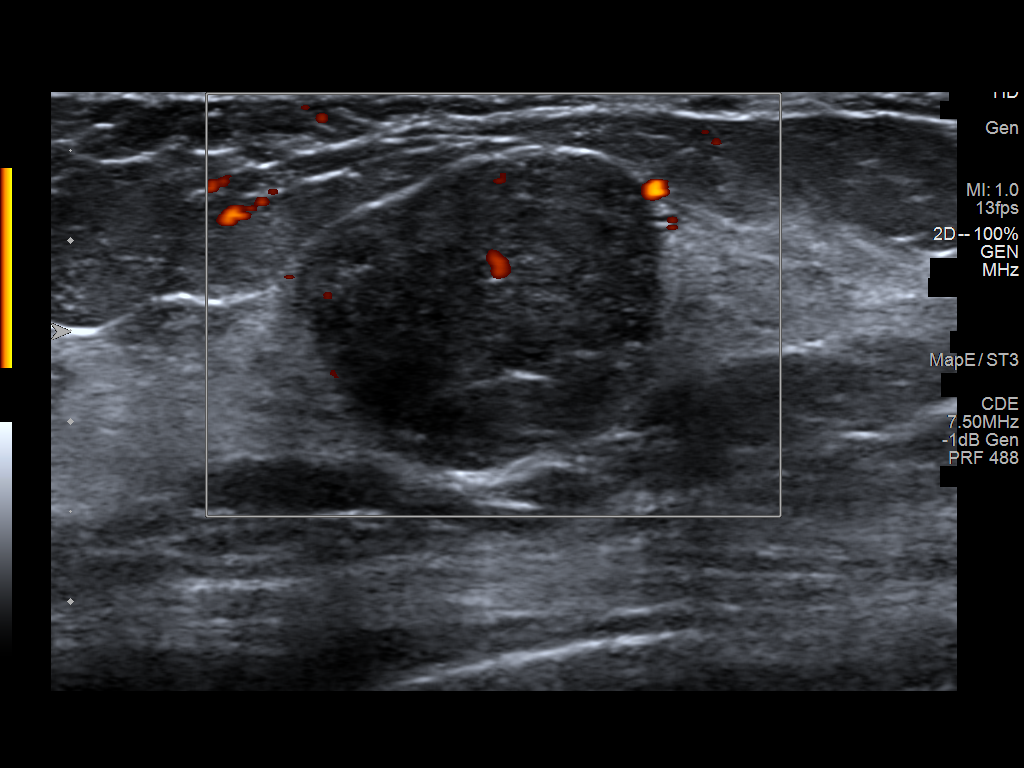
[im 4/6]
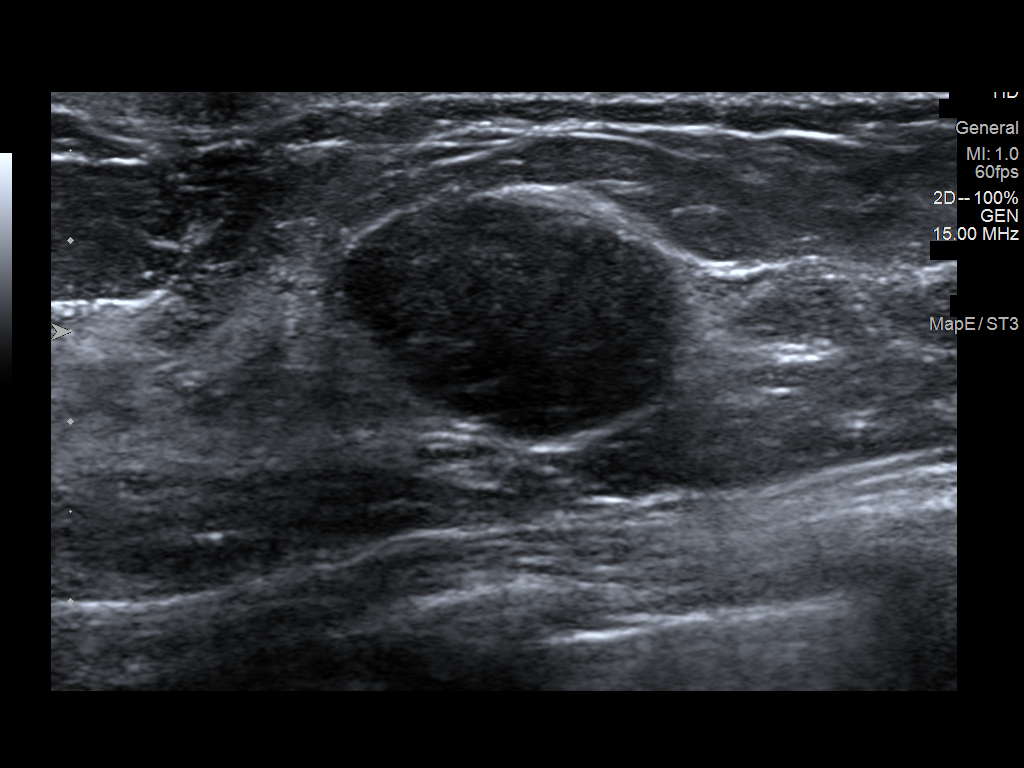
[im 5/6]
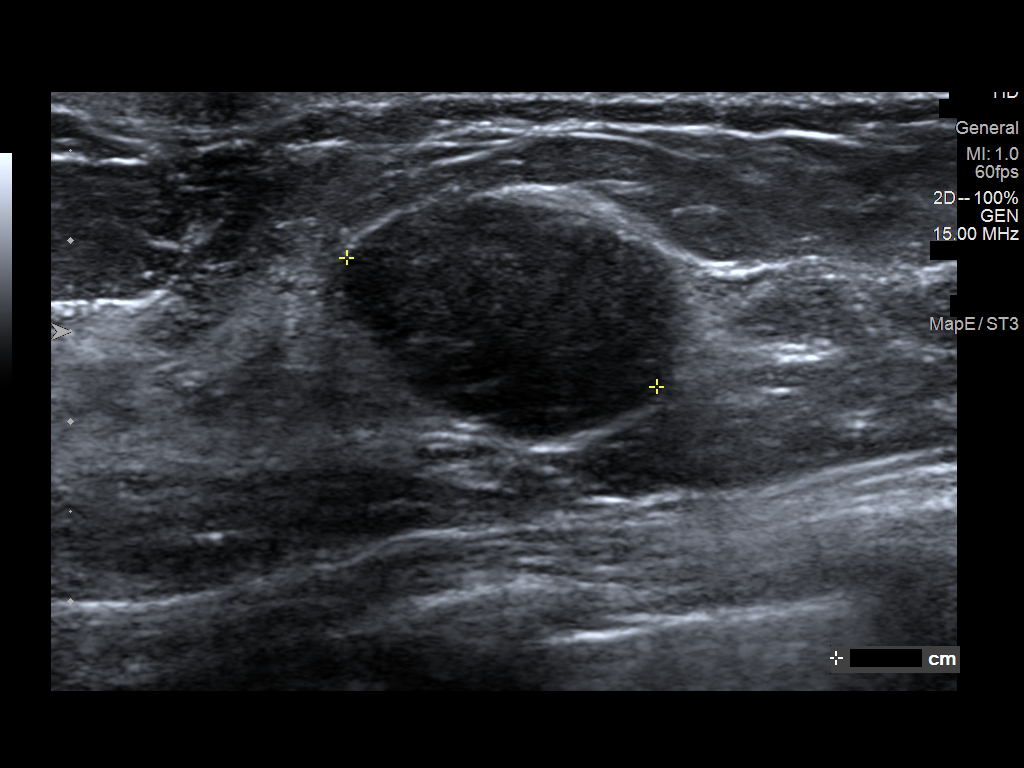
[im 6/6]
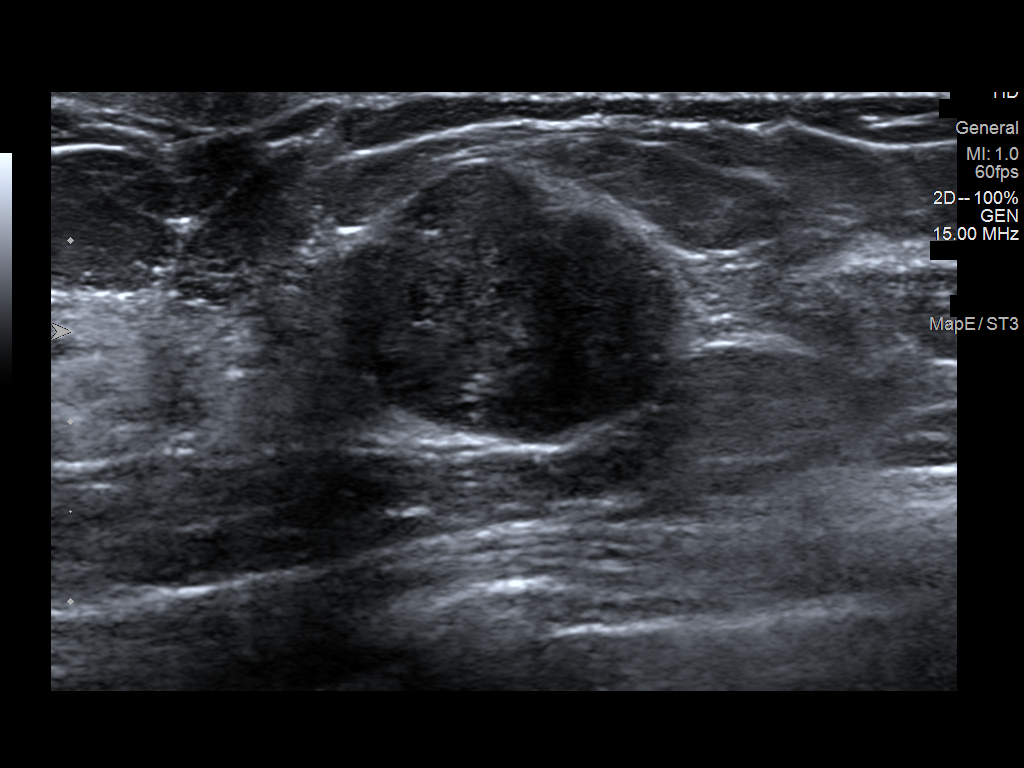

[6 of 6 positions shown; findings below may reference images not displayed]

ACR Breast Density Category c: The breast tissue is heterogeneously
dense, which may obscure small masses.
FINDINGS: A radiopaque BB was placed at the site of the patient's subareolar
right breast lump. An oval, circumscribed equal density mass is seen
deep to the radiopaque BB. Otherwise, no focal or suspicious
mammographic findings in either breast.

Targeted ultrasound is performed, showing an oval, circumscribed
hypoechoic mass at the 6 o'clock retroareolar position. It measures
2.1 x 1.6 x 1.9 cm. There is internal vascularity. This correlates
well with the mammographic finding in the patient's palpable lump.
IMPRESSION: 1. Probably benign, probable right breast fibroadenoma corresponding
with the patient's right breast lump. Recommend short-term imaging
follow-up.
2. No mammographic evidence of malignancy on the left.

RECOMMENDATION:
Right breast ultrasound in 6 months.

I have discussed the findings and recommendations with the patient.
If applicable, a reminder letter will be sent to the patient
regarding the next appointment.

BI-RADS CATEGORY  3: Probably benign.
